# Patient Record
Sex: Male | Born: 2000 | Race: Black or African American | Hispanic: No | Marital: Single | Smoking: Never smoker
Health system: Southern US, Community
[De-identification: ages and names within clinical notes are randomized; demographics above are authoritative.]

## PROBLEM LIST (undated history)

## (undated) DIAGNOSIS — D573 Sickle-cell trait: Secondary | ICD-10-CM

## (undated) DIAGNOSIS — R7303 Prediabetes: Secondary | ICD-10-CM

## (undated) DIAGNOSIS — J45909 Unspecified asthma, uncomplicated: Secondary | ICD-10-CM

## (undated) HISTORY — DX: Prediabetes: R73.03

## (undated) HISTORY — DX: Sickle-cell trait: D57.3

## (undated) HISTORY — DX: Unspecified asthma, uncomplicated: J45.909

---
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
## 2019-11-17 ENCOUNTER — Other Ambulatory Visit: Payer: Self-pay

## 2019-11-17 ENCOUNTER — Encounter (HOSPITAL_BASED_OUTPATIENT_CLINIC_OR_DEPARTMENT_OTHER): Payer: Self-pay | Admitting: *Deleted

## 2019-11-17 ENCOUNTER — Emergency Department (HOSPITAL_BASED_OUTPATIENT_CLINIC_OR_DEPARTMENT_OTHER)
Admission: EM | Admit: 2019-11-17 | Discharge: 2019-11-17 | Disposition: A | Discharge status: Home / Self Care | Payer: Medicaid Other | Attending: Emergency Medicine | Admitting: Emergency Medicine

## 2019-11-17 DIAGNOSIS — J45909 Unspecified asthma, uncomplicated: Secondary | ICD-10-CM | POA: Diagnosis not present

## 2019-11-17 DIAGNOSIS — K645 Perianal venous thrombosis: Secondary | ICD-10-CM

## 2019-11-17 DIAGNOSIS — K649 Unspecified hemorrhoids: Secondary | ICD-10-CM | POA: Insufficient documentation

## 2019-11-17 DIAGNOSIS — K6289 Other specified diseases of anus and rectum: Secondary | ICD-10-CM | POA: Diagnosis present

## 2019-11-17 NOTE — Discharge Instructions (Addendum)
Tucks Hemorrhoid kit (witch hazel wipes and numbing ointment)  http://thomas.info/ free calorie tracking  Contact a health care provider if you have: Increasing pain and swelling that are not controlled by treatment or medicine. Difficulty having a bowel movement, or you are unable to have a bowel movement. Pain or inflammation outside the area of the hemorrhoids. Get help right away if you have: Uncontrolled bleeding from your rectum.

## 2019-11-17 NOTE — ED Triage Notes (Signed)
Rectal pain x 3 days. He feels he has hemorrhoids.

## 2019-11-17 NOTE — ED Provider Notes (Signed)
[facility] EMERGENCY DEPARTMENT Provider Note   CSN: [id number] Arrival date & time: 11/17/19  1429     History Chief Complaint  Patient presents with  . Rectal Pain  . Hemorrhoids    Zachary Cortez is a 19 y.o. male who presents for evaluation of rectal pain.  Patient states that he noticed a small painful bump in his anus starting 2 days ago.  He has pain around the anus.  He denies any pain with defecation or pain inside the rectum.  He denies any recent heavy lifting or constipation.  Patient states that he took a picture of it and researched online is pretty sure that it is a hemorrhoid.  He states he is very interested in trying to lose weight because he read that obesity can be a risk factor for development and he states "I am only 19 years old."  HPI     Past Medical History:  Diagnosis Date  . Asthma   . Prediabetes   . Sickle cell trait [facility])     Patient Active Problem List   Diagnosis Date Noted  . Chest pain 09/23/2016  . Sports physical 09/12/2016  . 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 No pertinent surgical history.     Family History  Problem Relation Age of Onset  . Sudden death Neg Hx   . Heart attack Neg Hx     Social History   Tobacco Use  . Smoking status: Never Smoker  . Smokeless tobacco: Never Used  Substance Use Topics  . Alcohol use: Never  . Drug use: Never    Home Medications Prior to Admission medications   Not on File    Allergies    Patient has no known allergies.  Review of Systems   Review of Systems  Constitutional: Negative for chills and fever.  Gastrointestinal: Positive for rectal pain. Negative for abdominal pain, anal bleeding, blood in stool, constipation and diarrhea.  Musculoskeletal: Negative for myalgias.  Skin: Negative for rash and wound.   Physical Exam Updated Vital Signs BP 131/80   Pulse 97   Temp 99.2 F (37.3 C) (Oral)   Resp 18   Ht 6'  4.5" (1.943 m)   Wt (!) 187.2 kg   SpO2 97%   BMI 49.58 kg/m   Physical Exam Vitals and nursing note reviewed.  Constitutional:      General: He is not in acute distress.    Appearance: He is well-developed. He is not diaphoretic.  HENT:     Head: Normocephalic and atraumatic.  Eyes:     General: No scleral icterus.    Conjunctiva/sclera: Conjunctivae normal.  Cardiovascular:     Rate and Rhythm: Normal rate and regular rhythm.     Heart sounds: Normal heart sounds.  Pulmonary:     Effort: Pulmonary effort is normal. No respiratory distress.     Breath sounds: Normal breath sounds.  Abdominal:     Palpations: Abdomen is soft.     Tenderness: There is no abdominal tenderness.  Genitourinary:    Rectum: Tenderness and external hemorrhoid present. No mass, anal fissure or internal hemorrhoid.  Musculoskeletal:     Cervical back: Normal range of motion and neck supple.  Skin:    General: Skin is warm and dry.  Neurological:     Mental Status: He is alert.  Psychiatric:        Behavior: Behavior normal.     ED Results / Procedures /  Treatments   Labs (all labs ordered are listed, but only abnormal results are displayed) Labs Reviewed - No data to display  EKG None  Radiology No results found.  Procedures Procedures (including critical care time)  Medications Ordered in ED Medications - No data to display  ED Course  I have reviewed the triage vital signs and the nursing notes.  Pertinent labs & imaging results that were available during my care of the patient were reviewed by me and considered in my medical decision making (see chart for details).    MDM Rules/Calculators/A&P                         19 year old male here with rectal pain.  There is a small external thrombosed hemorrhoid.  No evidence of rectal abscess, fissure.  There is no bleeding.  Discussed outpatient treatment with over-the-counter medications.  We had a long discussion about exercise  and weight loss.  I have provided the patient with some handouts.  He appears otherwise appropriate for discharge.  Had a long discussion about return precautions. Final Clinical Impression(s) / ED Diagnoses Final diagnoses:  None    Rx / DC Orders ED Discharge Orders    None       Arthor Captain, PA-C 11/17/19 1754    Vanetta Mulders, MD 11/19/19 [phone]

## 2021-04-11 ENCOUNTER — Other Ambulatory Visit: Payer: Self-pay

## 2021-04-11 ENCOUNTER — Ambulatory Visit (INDEPENDENT_AMBULATORY_CARE_PROVIDER_SITE_OTHER): Payer: Medicaid Other | Admitting: Podiatry

## 2021-04-11 ENCOUNTER — Ambulatory Visit (INDEPENDENT_AMBULATORY_CARE_PROVIDER_SITE_OTHER): Payer: Medicaid Other

## 2021-04-11 VITALS — Ht 76.0 in | Wt 390.0 lb

## 2021-04-11 DIAGNOSIS — M722 Plantar fascial fibromatosis: Secondary | ICD-10-CM | POA: Diagnosis not present

## 2021-04-11 DIAGNOSIS — M2141 Flat foot [pes planus] (acquired), right foot: Secondary | ICD-10-CM

## 2021-04-11 DIAGNOSIS — M25362 Other instability, left knee: Secondary | ICD-10-CM

## 2021-04-11 DIAGNOSIS — M76822 Posterior tibial tendinitis, left leg: Secondary | ICD-10-CM

## 2021-04-11 DIAGNOSIS — M21862 Other specified acquired deformities of left lower leg: Secondary | ICD-10-CM

## 2021-04-11 DIAGNOSIS — M21861 Other specified acquired deformities of right lower leg: Secondary | ICD-10-CM

## 2021-04-11 DIAGNOSIS — M7750 Other enthesopathy of unspecified foot: Secondary | ICD-10-CM

## 2021-04-11 DIAGNOSIS — M25872 Other specified joint disorders, left ankle and foot: Secondary | ICD-10-CM

## 2021-04-11 DIAGNOSIS — M2142 Flat foot [pes planus] (acquired), left foot: Secondary | ICD-10-CM

## 2021-04-11 DIAGNOSIS — M7751 Other enthesopathy of right foot: Secondary | ICD-10-CM

## 2021-04-11 DIAGNOSIS — M25572 Pain in left ankle and joints of left foot: Secondary | ICD-10-CM

## 2021-04-11 DIAGNOSIS — M7752 Other enthesopathy of left foot: Secondary | ICD-10-CM

## 2021-04-11 MED ORDER — NAPROXEN SODIUM 550 MG PO TABS: 550.0000 mg | ORAL_TABLET | Freq: Two times a day (BID) | ORAL | 0 refills | Status: AC

## 2021-04-11 NOTE — Patient Instructions (Signed)
Call [phone] to schedule physical therapy ? ?Look for Voltaren gel at the pharmacy over the counter or online (also known as diclofenac 1% gel). Apply to the painful areas 3-4x daily with the supplied dosing card. Allow to dry for 10 minutes before going into socks/shoes ? ? ?Plantar Fasciitis (Heel Spur Syndrome) ?with Rehab ?The plantar fascia is a fibrous, ligament-like, soft-tissue structure that spans the bottom of the foot. Plantar fasciitis is a condition that causes pain in the foot due to inflammation of the tissue. ?SYMPTOMS  ?Pain and tenderness on the underneath side of the foot. ?Pain that worsens with standing or walking. ?CAUSES  ?Plantar fasciitis is caused by irritation and injury to the plantar fascia on the underneath side of the foot. Common mechanisms of injury include: ?Direct trauma to bottom of the foot. ?Damage to a small nerve that runs under the foot where the main fascia attaches to the heel bone. ?Stress placed on the plantar fascia due to bone spurs. ?RISK INCREASES WITH:  ?Activities that place stress on the plantar fascia (running, jumping, pivoting, or cutting). ?Poor strength and flexibility. ?Improperly fitted shoes. ?Tight calf muscles. ?Flat feet. ?Failure to warm-up properly before activity. ?Obesity. ?PREVENTION ?Warm up and stretch properly before activity. ?Allow for adequate recovery between workouts. ?Maintain physical fitness: ?Strength, flexibility, and endurance. ?Cardiovascular fitness. ?Maintain a health body weight. ?Avoid stress on the plantar fascia. ?Wear properly fitted shoes, including arch supports for individuals who have flat feet. ? ?PROGNOSIS  ?If treated properly, then the symptoms of plantar fasciitis usually resolve without surgery. However, occasionally surgery is necessary. ? ?RELATED COMPLICATIONS  ?Recurrent symptoms that may result in a chronic condition. ?Problems of the lower back that are caused by compensating for the injury, such as  limping. ?Pain or weakness of the foot during push-off following surgery. ?Chronic inflammation, scarring, and partial or complete fascia tear, occurring more often from repeated injections. ? ?TREATMENT  ?Treatment initially involves the use of ice and medication to help reduce pain and inflammation. The use of strengthening and stretching exercises may help reduce pain with activity, especially stretches of the Achilles tendon. These exercises may be performed at home or with a therapist. Your caregiver may recommend that you use heel cups of arch supports to help reduce stress on the plantar fascia. Occasionally, corticosteroid injections are given to reduce inflammation. If symptoms persist for greater than 6 months despite non-surgical (conservative), then surgery may be recommended.  ? ?MEDICATION  ?If pain medication is necessary, then nonsteroidal anti-inflammatory medications, such as aspirin and ibuprofen, or other minor pain relievers, such as acetaminophen, are often recommended. ?Do not take pain medication within 7 days before surgery. ?Prescription pain relievers may be given if deemed necessary by your caregiver. Use only as directed and only as much as you need. ?Corticosteroid injections may be given by your caregiver. These injections should be reserved for the most serious cases, because they may only be given a certain number of times. ? ?HEAT AND COLD ?Cold treatment (icing) relieves pain and reduces inflammation. Cold treatment should be applied for 10 to 15 minutes every 2 to 3 hours for inflammation and pain and immediately after any activity that aggravates your symptoms. Use ice packs or massage the area with a piece of ice (ice massage). ?Heat treatment may be used prior to performing the stretching and strengthening activities prescribed by your caregiver, physical therapist, or athletic trainer. Use a heat pack or soak the injury in warm water. ? ?  SEEK IMMEDIATE MEDICAL CARE  IF: ?Treatment seems to offer no benefit, or the condition worsens. ?Any medications produce adverse side effects. ? ?EXERCISES- ?RANGE OF MOTION (ROM) AND STRETCHING EXERCISES - Plantar Fasciitis (Heel Spur Syndrome) ?These exercises may help you when beginning to rehabilitate your injury. Your symptoms may resolve with or without further involvement from your physician, physical therapist or athletic trainer. While completing these exercises, remember:  ?Restoring tissue flexibility helps normal motion to return to the joints. This allows healthier, less painful movement and activity. ?An effective stretch should be held for at least 30 seconds. ?A stretch should never be painful. You should only feel a gentle lengthening or release in the stretched tissue. ? ?RANGE OF MOTION - Toe Extension, Flexion ?Sit with your right / left leg crossed over your opposite knee. ?Grasp your toes and gently pull them back toward the top of your foot. You should feel a stretch on the bottom of your toes and/or foot. ?Hold this stretch for 10 seconds. ?Now, gently pull your toes toward the bottom of your foot. You should feel a stretch on the top of your toes and or foot. ?Hold this stretch for 10 seconds. ?Repeat  times. Complete this stretch 3 times per day.  ? ?RANGE OF MOTION - Ankle Dorsiflexion, Active Assisted ?Remove shoes and sit on a chair that is preferably not on a carpeted surface. ?Place right / left foot under knee. Extend your opposite leg for support. ?Keeping your heel down, slide your right / left foot back toward the chair until you feel a stretch at your ankle or calf. If you do not feel a stretch, slide your bottom forward to the edge of the chair, while still keeping your heel down. ?Hold this stretch for 10 seconds. ?Repeat 3 times. Complete this stretch 2 times per day.  ? ?STRETCH  Gastroc, Standing ?Place hands on wall. ?Extend right / left leg, keeping the front knee somewhat bent. ?Slightly point your  toes inward on your back foot. ?Keeping your right / left heel on the floor and your knee straight, shift your weight toward the wall, not allowing your back to arch. ?You should feel a gentle stretch in the right / left calf. Hold this position for 10 seconds. ?Repeat 3 times. Complete this stretch 2 times per day. ? ?STRETCH  Soleus, Standing ?Place hands on wall. ?Extend right / left leg, keeping the other knee somewhat bent. ?Slightly point your toes inward on your back foot. ?Keep your right / left heel on the floor, bend your back knee, and slightly shift your weight over the back leg so that you feel a gentle stretch deep in your back calf. ?Hold this position for 10 seconds. ?Repeat 3 times. Complete this stretch 2 times per day. ? ?Mirant, Standing  ?Note: This exercise can place a lot of stress on your foot and ankle. Please complete this exercise only if specifically instructed by your caregiver.  ?Place the ball of your right / left foot on a step, keeping your other foot firmly on the same step. ?Hold on to the wall or a rail for balance. ?Slowly lift your other foot, allowing your body weight to press your heel down over the edge of the step. ?You should feel a stretch in your right / left calf. ?Hold this position for 10 seconds. ?Repeat this exercise with a slight bend in your right / left knee. ?Repeat 3 times. Complete this stretch 2 times per  day.  ? ?STRENGTHENING EXERCISES - Plantar Fasciitis (Heel Spur Syndrome)  ?These exercises may help you when beginning to rehabilitate your injury. They may resolve your symptoms with or without further involvement from your physician, physical therapist or athletic trainer. While completing these exercises, remember:  ?Muscles can gain both the endurance and the strength needed for everyday activities through controlled exercises. ?Complete these exercises as instructed by your physician, physical therapist or athletic trainer. Progress the  resistance and repetitions only as guided. ? ?STRENGTH - Towel Curls ?Sit in a chair positioned on a non-carpeted surface. ?Place your foot on a towel, keeping your heel on the floor. ?Pull the towel toward your heel by

## 2021-04-14 ENCOUNTER — Encounter: Payer: Self-pay | Admitting: Podiatry

## 2021-04-14 NOTE — Progress Notes (Signed)
?  Subjective:  ?Patient ID: Zachary Cortez, male    DOB: 07-08-2000,  MRN: [id number] ? ?Chief Complaint  ?Patient presents with  ? Diabetes  ? Plantar Fasciitis  ?    NP Left foot pain, plantar fascitis and diabetic foot exam  ? ? ?21 y.o. male presents with the above complaint. History confirmed with patient.  He has been having left heel pain for about 4 months.  He has very flat feet.  He was recently diagnosed with diabetes.  Feels he may have ingrown toenails of the right and left great toe.  Also having knee pain on the left ? ?Objective:  ?Physical Exam: ?warm, good capillary refill, no trophic changes or ulcerative lesions, normal DP and PT pulses, normal sensory exam, and severe pes planus with pain along the posterior tibial tendon on the left as well as the plantar heel at the plantar fascia.  Some tenderness in the sinus tarsi. ? ?Radiographs: ?Multiple views x-ray of the left foot: no fracture, dislocation, swelling or degenerative changes noted and severe pes planovalgus deformity noted ?Assessment:  ? ?1. Plantar fasciitis, left   ?2. Pes planus of both feet   ?3. 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 Gastrocnemius equinus of left lower extremity   ?7. Gastrocnemius equinus of right lower extremity   ?8. Instability of left knee joint   ?9. Tendonitis of ankle or foot   ? ? ? ?Plan:  ?Patient was evaluated and treated and all questions answered. ? ?Discussed the etiology and treatment options for plantar fasciitis including stretching, formal physical therapy, supportive shoegears such as a running shoe or sneaker, pre fabricated orthoses, injection therapy, and oral medications. We also discussed the role of surgical treatment of this for patients who do not improve after exhausting non-surgical treatment options. ? ? ?-XR reviewed with patient ?-Educated patient on stretching and  icing of the affected limb ?-Night splint dispensed ?-Injection delivered to the plantar fascia of the left foot. ?-Rx for Naprosyn 500 mg twice daily sent to pharmacy ? ?After sterile prep with povidone-iodine solution and alcohol, the left heel was injected with 0.5cc 2% xylocaine plain, 0.5cc 0.5% marcaine plain, 5mg  triamcinolone acetonide, and 2mg  dexamethasone was injected along the medial plantar fascia at the insertion on the plantar calcaneus. The patient tolerated the procedure well without complication. ? ?He does have ingrown toenails of both great toes.  We discussed permanent partial matricectomy.  He will return in 2 weeks for this. ? ?We also discussed his severe pes planovalgus deformity and how this contributes to his Planter fasciitis unlikely his knee pain as well.  I sent a referral for his knee pain to orthopedic surgery.  We discussed supporting the foot and ankle with a lace up ankle brace as well as custom molded foot orthosis and I provided him with a prescription to do this at [facility].  We also discussed surgical treatment of flatfoot deformity if this does not improve.  He would need to reduce his BMI prior to any surgical intervention ? ? ?Return in about 2 weeks (around 04/25/2021) for bilateral ingrown nail removal .  ? ?

## 2021-09-26 ENCOUNTER — Ambulatory Visit (INDEPENDENT_AMBULATORY_CARE_PROVIDER_SITE_OTHER): Payer: Medicaid Other | Admitting: Podiatry

## 2021-09-26 DIAGNOSIS — M2141 Flat foot [pes planus] (acquired), right foot: Secondary | ICD-10-CM

## 2021-09-26 DIAGNOSIS — L84 Corns and callosities: Secondary | ICD-10-CM

## 2021-09-26 DIAGNOSIS — M722 Plantar fascial fibromatosis: Secondary | ICD-10-CM | POA: Diagnosis not present

## 2021-09-26 DIAGNOSIS — M2142 Flat foot [pes planus] (acquired), left foot: Secondary | ICD-10-CM | POA: Diagnosis not present

## 2021-09-26 NOTE — Patient Instructions (Addendum)

## 2021-09-28 ENCOUNTER — Encounter: Payer: Self-pay | Admitting: Podiatry

## 2021-09-28 NOTE — Progress Notes (Signed)
  Subjective:  Patient ID: Zachary Cortez, male    DOB: September 18, 2000,  MRN: [id number]  Chief Complaint  Patient presents with   Foot Pain    Patient has PF on L foot. Wanting ankle brace and orthotics. Patient stated the steroid injection helped last time with the pain.     21 y.o. male presents with the above complaint. History confirmed with patient.  Says he is still having flatfoot pain.  He did not get his orthotics made he still has this prescription at home.  Objective:  Physical Exam: warm, good capillary refill, no trophic changes or ulcerative lesions, normal DP and PT pulses, normal sensory exam, and severe pes planus with pain along the posterior tibial tendon on the left as well as the plantar heel at the plantar fascia.  Some tenderness in the sinus tarsi.  Radiographs: Multiple views x-ray of the left foot: no fracture, dislocation, swelling or degenerative changes noted and severe pes planovalgus deformity noted Assessment:   1. Pes planus of both feet   2. Plantar fasciitis   3. Callus of foot      Plan:  Patient was evaluated and treated and all questions answered.  Discussed the etiology and treatment options for plantar fasciitis including stretching, formal physical therapy, supportive shoegears such as a running shoe or sneaker, pre fabricated orthoses, injection therapy, and oral medications. We also discussed the role of surgical treatment of this for patients who do not improve after exhausting non-surgical treatment options.   I again discussed with him that custom molded orthosis would likely give him the most benefit.  He did not get the orthotics made yet.  He still has the prescription and he will schedule this at [facility] clinic I recommended home PT for his plantar fasciitis.  He will take OTC ibuprofen at home.  He again asked about his ingrown nails that are currently not that painful.  I discussed with him that we can perform partial permanent  matricectomy when he is ready.  He will call for follow-up   No follow-ups on file.

## 2022-01-11 ENCOUNTER — Emergency Department (HOSPITAL_BASED_OUTPATIENT_CLINIC_OR_DEPARTMENT_OTHER)
Admission: EM | Admit: 2022-01-11 | Discharge: 2022-01-12 | Disposition: A | Discharge status: Home / Self Care | Payer: Medicaid Other | Attending: Emergency Medicine | Admitting: Emergency Medicine

## 2022-01-11 ENCOUNTER — Encounter (HOSPITAL_BASED_OUTPATIENT_CLINIC_OR_DEPARTMENT_OTHER): Payer: Self-pay

## 2022-01-11 ENCOUNTER — Other Ambulatory Visit: Payer: Self-pay

## 2022-01-11 DIAGNOSIS — R109 Unspecified abdominal pain: Secondary | ICD-10-CM | POA: Diagnosis present

## 2022-01-11 DIAGNOSIS — R112 Nausea with vomiting, unspecified: Secondary | ICD-10-CM | POA: Insufficient documentation

## 2022-01-11 DIAGNOSIS — Z1152 Encounter for screening for COVID-19: Secondary | ICD-10-CM | POA: Diagnosis not present

## 2022-01-11 DIAGNOSIS — R197 Diarrhea, unspecified: Secondary | ICD-10-CM | POA: Diagnosis not present

## 2022-01-11 LAB — URINALYSIS, MICROSCOPIC (REFLEX)
RBC / HPF: NONE SEEN RBC/hpf (ref 0–5)
WBC, UA: NONE SEEN WBC/hpf (ref 0–5)

## 2022-01-11 LAB — COMPREHENSIVE METABOLIC PANEL
ALT: 19 U/L (ref 0–44)
AST: 18 U/L (ref 15–41)
Albumin: 4.2 g/dL (ref 3.5–5.0)
Alkaline Phosphatase: 50 U/L (ref 38–126)
Anion gap: 10 (ref 5–15)
BUN: 15 mg/dL (ref 6–20)
CO2: 22 mmol/L (ref 22–32)
Calcium: 9 mg/dL (ref 8.9–10.3)
Chloride: 108 mmol/L (ref 98–111)
Creatinine, Ser: 1.32 mg/dL — ABNORMAL HIGH (ref 0.61–1.24)
GFR, Estimated: >60 mL/min (ref >60)
Glucose, Bld: 86 mg/dL (ref 70–99)
Potassium: 3.8 mmol/L (ref 3.5–5.1)
Sodium: 140 mmol/L (ref 135–145)
Total Bilirubin: 0.6 mg/dL (ref 0.3–1.2)
Total Protein: 8 g/dL (ref 6.5–8.1)

## 2022-01-11 LAB — RESP PANEL BY RT-PCR (RSV, FLU A&B, COVID)  RVPGX2
Influenza A by PCR: NEGATIVE
Influenza B by PCR: NEGATIVE
Resp Syncytial Virus by PCR: NEGATIVE
SARS Coronavirus 2 by RT PCR: NEGATIVE

## 2022-01-11 LAB — URINALYSIS, ROUTINE W REFLEX MICROSCOPIC
Glucose, UA: NEGATIVE mg/dL
Hgb urine dipstick: NEGATIVE
Ketones, ur: NEGATIVE mg/dL
Leukocytes,Ua: NEGATIVE
Nitrite: NEGATIVE
Protein, ur: 100 mg/dL — AB
Specific Gravity, Urine: >=1.03 (ref 1.005–1.030)
pH: 5.5 (ref 5.0–8.0)

## 2022-01-11 LAB — CBC
HCT: 39.5 % (ref 39.0–52.0)
Hemoglobin: 12.7 g/dL — ABNORMAL LOW (ref 13.0–17.0)
MCH: 26.7 pg (ref 26.0–34.0)
MCHC: 32.2 g/dL (ref 30.0–36.0)
MCV: 83.2 fL (ref 80.0–100.0)
Platelets: 255 10*3/uL (ref 150–400)
RBC: 4.75 MIL/uL (ref 4.22–5.81)
RDW: 14 % (ref 11.5–15.5)
WBC: 6.9 10*3/uL (ref 4.0–10.5)
nRBC: 0 % (ref 0.0–0.2)

## 2022-01-11 LAB — LIPASE, BLOOD: Lipase: 24 U/L (ref 11–51)

## 2022-01-11 NOTE — ED Triage Notes (Signed)
Pt arrives to ED POV c/o Epigastric pain, and has been having N/V/D since last night. No other complaints at this time.

## 2022-01-12 MED ORDER — LACTATED RINGERS IV BOLUS
1000.0000 mL | Freq: Once | INTRAVENOUS | Status: AC
  Administered 2022-01-12: 1000 mL via INTRAVENOUS

## 2022-01-12 MED ORDER — ONDANSETRON 4 MG PO TBDP: 4.0000 mg | ORAL_TABLET | Freq: Three times a day (TID) | ORAL | 0 refills | Status: DC | PRN

## 2022-01-12 MED ORDER — ACETAMINOPHEN 325 MG PO TABS
650.0000 mg | ORAL_TABLET | Freq: Once | ORAL | Status: AC
  Administered 2022-01-12: 650 mg via ORAL
  Filled 2022-01-12: qty 2

## 2022-01-12 MED ORDER — ONDANSETRON HCL 4 MG/2ML IJ SOLN
4.0000 mg | Freq: Once | INTRAMUSCULAR | Status: AC
  Administered 2022-01-12: 4 mg via INTRAVENOUS
  Filled 2022-01-12: qty 2

## 2022-01-12 NOTE — ED Provider Notes (Signed)
[facility] EMERGENCY DEPARTMENT  Provider Note  CSN: [id number] Arrival date & time: 01/11/22 1901  History Chief Complaint  Patient presents with   Abdominal Pain    Zachary Cortez is a 21 y.o. male reports 24 hours of intermittent diffuse cramping abdominal pain, watery diarrhea and persistent vomiting. Unable to keep fluids down at work today. Has felt hot, but no measured fever. No blood in vomit or stool.    Home Medications Prior to Admission medications   Medication Sig Start Date End Date Taking? Authorizing Provider  ondansetron (ZOFRAN-ODT) 4 MG disintegrating tablet Take 1 tablet (4 mg total) by mouth every 8 (eight) hours as needed for nausea or vomiting. 01/12/22  Yes Pollyann Savoy, MD     Allergies    Patient has no known allergies.   Review of Systems   Review of Systems Please see HPI for pertinent positives and negatives  Physical Exam BP (!) 156/98   Pulse 80   Temp 98.2 F (36.8 C) (Oral)   Resp 18   Ht 6\' 4"  (1.93 m)   Wt (!) 176.9 kg   SpO2 100%   BMI 47.47 kg/m   Physical Exam Vitals and nursing note reviewed.  Constitutional:      Appearance: Normal appearance.  HENT:     Head: Normocephalic and atraumatic.     Nose: Nose normal.     Mouth/Throat:     Mouth: Mucous membranes are moist.  Eyes:     Extraocular Movements: Extraocular movements intact.     Conjunctiva/sclera: Conjunctivae normal.  Cardiovascular:     Rate and Rhythm: Normal rate.  Pulmonary:     Effort: Pulmonary effort is normal.     Breath sounds: Normal breath sounds.  Abdominal:     General: Abdomen is flat.     Palpations: Abdomen is soft.     Tenderness: There is no abdominal tenderness. There is no guarding. Negative signs include Murphy's sign and McBurney's sign.  Musculoskeletal:        General: No swelling. Normal range of motion.     Cervical back: Neck supple.  Skin:    General: Skin is warm and dry.  Neurological:     General: No  focal deficit present.     Mental Status: He is alert.  Psychiatric:        Mood and Affect: Mood normal.     ED Results / Procedures / Treatments   EKG None  Procedures Procedures  Medications Ordered in the ED Medications  ondansetron (ZOFRAN) injection 4 mg (4 mg Intravenous Given 01/12/22 0106)  lactated ringers bolus 1,000 mL (0 mLs Intravenous Stopped 01/12/22 0215)  acetaminophen (TYLENOL) tablet 650 mg (650 mg Oral Given 01/12/22 0219)    Initial Impression and Plan  Patient here with N/V/D and abdominal cramping x 24 hours, likely a viral process. Less likely biliary colic, IBD, IBS. Labs done in triage show normal CBC, CMP with mildly increased Cr, no old to compare. UA is concentrated but no infection. Nasal swab is neg. Will give IVF and antiemetics for his nausea and reassess.   ED Course   Clinical Course as of 01/12/22 0220  Fri Jan 12, 2022  0218 Patient feeling better. Tolerating PO fluids and comfortable going home. Rx for Zofran, advance diet as tolerated. RTED for any worsening pain, intractable vomiting or for any other concerns.  [CS]    Clinical Course User Index [CS] Jan 14, 2022, MD  MDM Rules/Calculators/A&P Medical Decision Making Problems Addressed: Nausea vomiting and diarrhea: acute illness or injury  Amount and/or Complexity of Data Reviewed Labs: ordered. Decision-making details documented in ED Course.  Risk OTC drugs. Prescription drug management.    Final Clinical Impression(s) / ED Diagnoses Final diagnoses:  Nausea vomiting and diarrhea    Rx / DC Orders ED Discharge Orders          Ordered    ondansetron (ZOFRAN-ODT) 4 MG disintegrating tablet  Every 8 hours PRN        01/12/22 0219             Pollyann Savoy, MD 01/12/22 [id number]

## 2022-01-12 NOTE — ED Notes (Signed)
Ginger Ale provided, pt tolerated well. 

## 2022-08-21 ENCOUNTER — Ambulatory Visit (INDEPENDENT_AMBULATORY_CARE_PROVIDER_SITE_OTHER): Payer: Medicaid Other | Admitting: Adult Health

## 2022-08-21 ENCOUNTER — Encounter: Payer: Self-pay | Admitting: Adult Health

## 2022-08-21 VITALS — BP 130/90 | HR 82 | Ht 76.0 in | Wt >= 6400 oz

## 2022-08-21 DIAGNOSIS — R0683 Snoring: Secondary | ICD-10-CM | POA: Diagnosis not present

## 2022-08-21 NOTE — Assessment & Plan Note (Signed)
Snoring, restless sleep, daytime sleepiness, BMI 51 all suspicious for underlying sleep apnea.  Will set patient up for home sleep study patient education given on sleep apnea.  Healthy sleep regimen discussed  - discussed how weight can impact sleep and risk for sleep disordered breathing - discussed options to assist with weight loss: combination of diet modification, cardiovascular and strength training exercises   - had an extensive discussion regarding the adverse health consequences related to untreated sleep disordered breathing - specifically discussed the risks for hypertension, coronary artery disease, cardiac dysrhythmias, cerebrovascular disease, and diabetes - lifestyle modification discussed   - discussed how sleep disruption can increase risk of accidents, particularly when driving - safe driving practices were discussed   Plan  Patient Instructions  Set up for home sleep study  Work on healthy weight loss  Do not drive if sleepy.  Healthy sleep regimen  Follow up with in 6 weeks to discuss results and treatment plan   +

## 2022-08-21 NOTE — Patient Instructions (Signed)
Set up for home sleep study  Work on healthy weight loss  Do not drive if sleepy.  Healthy sleep regimen  Follow up with in 6 weeks to discuss results and treatment plan

## 2022-08-21 NOTE — Assessment & Plan Note (Signed)
Healthy weight loss 

## 2022-08-21 NOTE — Progress Notes (Signed)
@Patient  ID: Zachary Cortez, male    DOB: Jul 07, 2000, 22 y.o.   MRN: [id number]  Chief Complaint  Patient presents with   Consult    Referring provider: Norm Salt, [place]  HPI: 22 year old male seen for sleep consult August 21, 2022 for snoring, restless sleep and daytime sleepiness  TEST/EVENTS :   08/21/2022 Sleep consult  Patient presents for a sleep consult today.  Patient complains that no matter how much sleep he gets he always feels tired.  Complains of loud snoring.  Witnessed apneic events.  Sleep is very restless.  He feels tired all the time with no energy.  Can take up to 2 naps a day.  Sometimes has trouble falling asleep and staying asleep.  Typically goes to bed between 10 PM and 3 AM.  Can take up to an hour or longer to go to sleep.  Is up multiple times throughout the night.  Gets up at 7 AM.  Weight is up 40 pounds over the last 2 years.  Current weight is at 420 pounds with a BMI at 51.  Patient says that he has been having sleep issues since he was in middle school.  Would fall asleep frequently during the daytime.  Has snored as long as he can remember.  Says he has always felt tired.  Does not take any sleep aids.  Does not have any removable dental work.  No symptoms suspicious for cataplexy or sleep paralysis.  No known history of congestive heart failure or stroke. Patient works Acupuncturist.  Epworth score is 8 out of 24.  Typically gets sleepy if he sits down to watch TV, passenger of a car and in the evening hours.  SH: Patient is single.  Does not have any children.  Lives at home with his family and girlfriend.  Works full-time dayshift 9 AM to 6 PM.  Works in Engineering geologist.  Is a never smoker.  No drugs.  Social alcohol  Surgical , none   FH ; + Asthma, OSA   PMH : Asthma and HTN    No Known Allergies   There is no immunization history on file for this patient.  Past Medical History:  Diagnosis Date   Asthma    Prediabetes    Sickle cell trait  (HCC)     Tobacco History: Social History   Tobacco Use  Smoking Status Never  Smokeless Tobacco Never   Counseling given: Not Answered   Outpatient Medications Prior to Visit  Medication Sig Dispense Refill   albuterol (VENTOLIN HFA) 108 (90 Base) MCG/ACT inhaler Inhale 2 puffs into the lungs every 6 (six) hours as needed for wheezing or shortness of breath.     fluticasone (FLONASE) 50 MCG/ACT nasal spray Place 1 spray into both nostrils daily as needed for allergies or rhinitis.     losartan (COZAAR) 25 MG tablet Take 25 mg by mouth daily.     metFORMIN (GLUCOPHAGE) 500 MG tablet Take 500 mg by mouth 2 (two) times daily with a meal.     montelukast (SINGULAIR) 10 MG tablet Take 10 mg by mouth at bedtime.     ondansetron (ZOFRAN-ODT) 4 MG disintegrating tablet Take 1 tablet (4 mg total) by mouth every 8 (eight) hours as needed for nausea or vomiting. (Patient not taking: Reported on 08/21/2022) 20 tablet 0   No facility-administered medications prior to visit.     Review of Systems:   Constitutional:   No  weight loss, night sweats,  Fevers, chills,  +fatigue, or  lassitude.  HEENT:   No headaches,  Difficulty swallowing,  Tooth/dental problems, or  Sore throat,                No sneezing, itching, ear ache, nasal congestion, post nasal drip,   CV:  No chest pain,  Orthopnea, PND, swelling in lower extremities, anasarca, dizziness, palpitations, syncope.   GI  No heartburn, indigestion, abdominal pain, nausea, vomiting, diarrhea, change in bowel habits, loss of appetite, bloody stools.   Resp: No shortness of breath with exertion or at rest.  No excess mucus, no productive cough,  No non-productive cough,  No coughing up of blood.  No change in color of mucus.  No wheezing.  No chest wall deformity  Skin: no rash or lesions.  GU: no dysuria, change in color of urine, no urgency or frequency.  No flank pain, no hematuria   MS:  No joint pain or swelling.  No decreased range  of motion.  No back pain.    Physical Exam  BP (!) 130/90 (BP Location: Left Wrist, Patient Position: Sitting, Cuff Size: Normal)   Pulse 82   Ht 6\' 4"  (1.93 m)   Wt (!) 420 lb (190.5 kg)   SpO2 99%   BMI 51.12 kg/m   GEN: A/Ox3; pleasant , NAD, well nourished    HEENT:  [place]/AT,   NOSE-clear, THROAT-clear, no lesions, no postnasal drip or exudate noted.  Class 3-4 MP airway   NECK:  Supple w/ fair ROM; no JVD; normal carotid impulses w/o bruits; no thyromegaly or nodules palpated; no lymphadenopathy.    RESP  Clear  P & A; w/o, wheezes/ rales/ or rhonchi. no accessory muscle use, no dullness to percussion  CARD:  RRR, no m/r/g, no peripheral edema, pulses intact, no cyanosis or clubbing.  GI:   Soft & nt; nml bowel sounds; no organomegaly or masses detected.   Musco: Warm bil, no deformities or joint swelling noted.   Neuro: alert, no focal deficits noted.    Skin: Warm, no lesions or rashes    Lab Results:  CBC   BMET   BNP No results found for: "BNP"  ProBNP No results found for: "PROBNP"  Imaging: No results found.  Administration History     None           No data to display          No results found for: "NITRICOXIDE"      Assessment & Plan:   Snoring Snoring, restless sleep, daytime sleepiness, BMI 51 all suspicious for underlying sleep apnea.  Will set patient up for home sleep study patient education given on sleep apnea.  Healthy sleep regimen discussed  - discussed how weight can impact sleep and risk for sleep disordered breathing - discussed options to assist with weight loss: combination of diet modification, cardiovascular and strength training exercises   - had an extensive discussion regarding the adverse health consequences related to untreated sleep disordered breathing - specifically discussed the risks for hypertension, coronary artery disease, cardiac dysrhythmias, cerebrovascular disease, and diabetes - lifestyle  modification discussed   - discussed how sleep disruption can increase risk of accidents, particularly when driving - safe driving practices were discussed   Plan  Patient Instructions  Set up for home sleep study  Work on healthy weight loss  Do not drive if sleepy.  Healthy sleep regimen  Follow up with in 6 weeks to discuss results and treatment  plan   +  Morbid obesity (HCC) Healthy weight loss      Rubye Oaks, NP 08/21/2022

## 2022-08-24 NOTE — Progress Notes (Signed)
Reviewed and agree with assessment/plan.   Coralyn Helling, MD [facility] Pulmonary/Critical Care 08/24/2022, 3:43 PM Pager:  [phone]

## 2022-10-02 ENCOUNTER — Telehealth: Payer: Self-pay | Admitting: *Deleted

## 2022-10-02 ENCOUNTER — Ambulatory Visit: Payer: Medicaid Other | Admitting: Adult Health

## 2022-10-02 NOTE — Telephone Encounter (Signed)
Called and spoke with patient, he has not had his HST done yet.  Advised I would cancel his appointment for today and he will need to contact [facility] to reschedule and then call our office once the study is complete to schedule a 2 week follow up.  He stated he had a pamphlet with a QR code on it and was not sure if he still had it.  I let him know that if he has miss placed it, we can put another one for him up front.  He verbalized understanding.  Nothing further needed.
